# Patient Record
Sex: Female | Born: 1975 | Race: White | Hispanic: No | Marital: Single
Health system: Southern US, Community
[De-identification: ages and names within clinical notes are randomized; demographics above are authoritative.]

---

## 2009-04-19 ENCOUNTER — Emergency Department (HOSPITAL_COMMUNITY): Admission: EM | Admit: 2009-04-19 | Discharge: 2009-04-19 | Payer: Self-pay | Admitting: Family Medicine

## 2010-08-15 ENCOUNTER — Emergency Department (HOSPITAL_COMMUNITY): Admission: EM | Admit: 2010-08-15 | Discharge: 2010-08-15 | Payer: Self-pay | Admitting: Emergency Medicine

## 2011-05-06 ENCOUNTER — Emergency Department (HOSPITAL_COMMUNITY)
Admission: EM | Admit: 2011-05-06 | Discharge: 2011-05-06 | Disposition: A | Discharge status: Home / Self Care | Payer: 59 | Attending: Emergency Medicine | Admitting: Emergency Medicine

## 2011-05-06 ENCOUNTER — Emergency Department (HOSPITAL_COMMUNITY): Payer: 59

## 2011-05-06 DIAGNOSIS — K5900 Constipation, unspecified: Secondary | ICD-10-CM | POA: Insufficient documentation

## 2011-05-06 DIAGNOSIS — F411 Generalized anxiety disorder: Secondary | ICD-10-CM | POA: Insufficient documentation

## 2011-05-06 DIAGNOSIS — R1031 Right lower quadrant pain: Secondary | ICD-10-CM | POA: Insufficient documentation

## 2011-05-06 DIAGNOSIS — R109 Unspecified abdominal pain: Secondary | ICD-10-CM | POA: Insufficient documentation

## 2011-05-06 DIAGNOSIS — J45909 Unspecified asthma, uncomplicated: Secondary | ICD-10-CM | POA: Insufficient documentation

## 2011-05-06 DIAGNOSIS — R112 Nausea with vomiting, unspecified: Secondary | ICD-10-CM | POA: Insufficient documentation

## 2011-05-06 LAB — DIFFERENTIAL
Basophils Absolute: 0 10*3/uL (ref 0.0–0.1)
Basophils Relative: 0 % (ref 0–1)
Eosinophils Absolute: 0.1 10*3/uL (ref 0.0–0.7)
Eosinophils Relative: 1 % (ref 0–5)
Lymphocytes Relative: 14 % (ref 12–46)
Lymphs Abs: 1.4 10*3/uL (ref 0.7–4.0)
Monocytes Absolute: 0.8 10*3/uL (ref 0.1–1.0)
Monocytes Relative: 8 % (ref 3–12)
Neutro Abs: 7.7 10*3/uL (ref 1.7–7.7)
Neutrophils Relative %: 77 % (ref 43–77)

## 2011-05-06 LAB — BASIC METABOLIC PANEL
GFR calc non Af Amer: >60 mL/min (ref >60)
Glucose, Bld: 97 mg/dL (ref 70–99)
Potassium: 4.3 mEq/L (ref 3.5–5.1)
Sodium: 135 mEq/L (ref 135–145)

## 2011-05-06 LAB — URINALYSIS, ROUTINE W REFLEX MICROSCOPIC
Bilirubin Urine: NEGATIVE
Glucose, UA: NEGATIVE mg/dL
Hgb urine dipstick: NEGATIVE
Nitrite: NEGATIVE
Protein, ur: NEGATIVE mg/dL
Specific Gravity, Urine: >1.03 — ABNORMAL HIGH (ref 1.005–1.030)
Urobilinogen, UA: 0.2 mg/dL (ref 0.0–1.0)
pH: 5 (ref 5.0–8.0)

## 2011-05-06 LAB — URINE MICROSCOPIC-ADD ON

## 2011-05-06 LAB — CBC
HCT: 41.9 % (ref 36.0–46.0)
Hemoglobin: 13.4 g/dL (ref 12.0–15.0)
RDW: 13.4 % (ref 11.5–15.5)
WBC: 10 10*3/uL (ref 4.0–10.5)

## 2017-03-15 ENCOUNTER — Emergency Department
Admission: EM | Admit: 2017-03-15 | Discharge: 2017-03-15 | Payer: Self-pay | Attending: Emergency Medicine | Admitting: Emergency Medicine

## 2017-03-15 ENCOUNTER — Emergency Department: Payer: Self-pay

## 2017-03-15 DIAGNOSIS — Z5181 Encounter for therapeutic drug level monitoring: Secondary | ICD-10-CM | POA: Insufficient documentation

## 2017-03-15 DIAGNOSIS — Z9689 Presence of other specified functional implants: Secondary | ICD-10-CM

## 2017-03-15 DIAGNOSIS — T59811A Toxic effect of smoke, accidental (unintentional), initial encounter: Secondary | ICD-10-CM

## 2017-03-15 DIAGNOSIS — T300 Burn of unspecified body region, unspecified degree: Secondary | ICD-10-CM

## 2017-03-15 DIAGNOSIS — J705 Respiratory conditions due to smoke inhalation: Secondary | ICD-10-CM | POA: Insufficient documentation

## 2017-03-15 DIAGNOSIS — N9489 Other specified conditions associated with female genital organs and menstrual cycle: Secondary | ICD-10-CM | POA: Insufficient documentation

## 2017-03-15 LAB — BLOOD GAS, ARTERIAL
ACID-BASE DEFICIT: 7 mmol/L — AB (ref 0.0–2.0)
Bicarbonate: 21.8 mmol/L (ref 20.0–28.0)
FIO2: 1
LHR: 16 {breaths}/min
O2 Saturation: 100 %
PCO2 ART: 57 mmHg — AB (ref 32.0–48.0)
PEEP: 5 cmH2O
Patient temperature: 37
VT: 450 mL
pH, Arterial: 7.19 — CL (ref 7.350–7.450)
pO2, Arterial: 424 mmHg — ABNORMAL HIGH (ref 83.0–108.0)

## 2017-03-15 LAB — CBC WITH DIFFERENTIAL/PLATELET
BASOS PCT: 1 %
Basophils Absolute: 0.1 10*3/uL (ref 0–0.1)
EOS ABS: 0.5 10*3/uL (ref 0–0.7)
Eosinophils Relative: 5 %
HCT: 38.6 % (ref 35.0–47.0)
HEMOGLOBIN: 12.8 g/dL (ref 12.0–16.0)
LYMPHS ABS: 4.8 10*3/uL — AB (ref 1.0–3.6)
Lymphocytes Relative: 43 %
MCH: 31.5 pg (ref 26.0–34.0)
MCHC: 33.2 g/dL (ref 32.0–36.0)
MCV: 95 fL (ref 80.0–100.0)
MONO ABS: 1.3 10*3/uL — AB (ref 0.2–0.9)
MONOS PCT: 11 %
NEUTROS PCT: 40 %
Neutro Abs: 4.5 10*3/uL (ref 1.4–6.5)
Platelets: 455 10*3/uL — ABNORMAL HIGH (ref 150–440)
RBC: 4.06 MIL/uL (ref 3.80–5.20)
RDW: 14.2 % (ref 11.5–14.5)
WBC: 11.2 10*3/uL — ABNORMAL HIGH (ref 3.6–11.0)

## 2017-03-15 LAB — COMPREHENSIVE METABOLIC PANEL
ALBUMIN: 4.2 g/dL (ref 3.5–5.0)
ALK PHOS: 55 U/L (ref 38–126)
ALT: 14 U/L (ref 14–54)
ANION GAP: 8 (ref 5–15)
AST: 21 U/L (ref 15–41)
BILIRUBIN TOTAL: 0.6 mg/dL (ref 0.3–1.2)
BUN: 13 mg/dL (ref 6–20)
CALCIUM: 8.8 mg/dL — AB (ref 8.9–10.3)
CO2: 22 mmol/L (ref 22–32)
CREATININE: 0.76 mg/dL (ref 0.44–1.00)
Chloride: 108 mmol/L (ref 101–111)
GFR calc Af Amer: >60 mL/min (ref >60)
GFR calc non Af Amer: >60 mL/min (ref >60)
GLUCOSE: 98 mg/dL (ref 65–99)
Potassium: 4.1 mmol/L (ref 3.5–5.1)
SODIUM: 138 mmol/L (ref 135–145)
TOTAL PROTEIN: 7.7 g/dL (ref 6.5–8.1)

## 2017-03-15 LAB — URINALYSIS, COMPLETE (UACMP) WITH MICROSCOPIC
BACTERIA UA: NONE SEEN
Bilirubin Urine: NEGATIVE
GLUCOSE, UA: NEGATIVE mg/dL
Hgb urine dipstick: NEGATIVE
Ketones, ur: NEGATIVE mg/dL
LEUKOCYTES UA: NEGATIVE
NITRITE: NEGATIVE
PH: 5 (ref 5.0–8.0)
Protein, ur: 100 mg/dL — AB
Specific Gravity, Urine: 1.026 (ref 1.005–1.030)

## 2017-03-15 LAB — HCG, QUANTITATIVE, PREGNANCY: hCG, Beta Chain, Quant, S: 1 m[IU]/mL (ref <5)

## 2017-03-15 LAB — LACTIC ACID, PLASMA: LACTIC ACID, VENOUS: 1.2 mmol/L (ref 0.5–1.9)

## 2017-03-15 LAB — PROTIME-INR
INR: 0.92
Prothrombin Time: 12.3 seconds (ref 11.4–15.2)

## 2017-03-15 LAB — ETHANOL: Alcohol, Ethyl (B): 77 mg/dL — ABNORMAL HIGH (ref <5)

## 2017-03-15 MED ORDER — ROCURONIUM BROMIDE 50 MG/5ML IV SOLN
80.0000 mg | Freq: Once | INTRAVENOUS | Status: AC
  Administered 2017-03-15: 80 mg via INTRAVENOUS

## 2017-03-15 MED ORDER — FENTANYL 2500MCG IN NS 250ML (10MCG/ML) PREMIX INFUSION
25.0000 ug/h | INTRAVENOUS | Status: DC
  Administered 2017-03-15: 100 ug/h via INTRAVENOUS
  Filled 2017-03-15: qty 250

## 2017-03-15 MED ORDER — PROPOFOL 10 MG/ML IV BOLUS
80.0000 mg | Freq: Once | INTRAVENOUS | Status: AC
  Administered 2017-03-15: 80 mg via INTRAVENOUS

## 2017-03-15 MED ORDER — LACTATED RINGERS IV BOLUS (SEPSIS)
2000.0000 mL | Freq: Once | INTRAVENOUS | Status: AC
  Administered 2017-03-15: 2000 mL via INTRAVENOUS

## 2017-03-15 MED ORDER — SODIUM CHLORIDE 0.9 % IV SOLN
25.0000 ug/h | INTRAVENOUS | Status: DC
  Administered 2017-03-15: 100 ug/h via INTRAVENOUS

## 2017-03-15 MED ORDER — ETOMIDATE 2 MG/ML IV SOLN
15.0000 mg | Freq: Once | INTRAVENOUS | Status: AC
  Administered 2017-03-15: 15 mg via INTRAVENOUS

## 2017-03-15 MED ORDER — FENTANYL CITRATE (PF) 100 MCG/2ML IJ SOLN
100.0000 ug | Freq: Once | INTRAMUSCULAR | Status: AC
  Administered 2017-03-15: 100 ug via INTRAVENOUS

## 2017-03-15 MED ORDER — ETOMIDATE 2 MG/ML IV SOLN
30.0000 mg | Freq: Once | INTRAVENOUS | Status: AC
  Administered 2017-03-15: 30 mg via INTRAVENOUS

## 2017-03-15 MED ORDER — SUCCINYLCHOLINE CHLORIDE 20 MG/ML IJ SOLN
180.0000 mg | Freq: Once | INTRAMUSCULAR | Status: AC
  Administered 2017-03-15: 180 mg via INTRAVENOUS

## 2017-03-15 MED ORDER — PROPOFOL 1000 MG/100ML IV EMUL
5.0000 ug/kg/min | Freq: Once | INTRAVENOUS | Status: AC
  Administered 2017-03-15: 5 ug/kg/min via INTRAVENOUS

## 2017-03-15 NOTE — ED Notes (Signed)
80 rocuronium and 15 of etomidate given

## 2017-03-15 NOTE — ED Notes (Signed)
EDP prepping for chest tube for right pneumothorax
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
## 2017-03-18 LAB — COOXEMETRY PANEL
Carboxyhemoglobin: 4.8 % — ABNORMAL HIGH (ref 0.5–1.5)
METHEMOGLOBIN: 1.6 % — AB (ref 0.0–1.5)
O2 SAT: UNDETERMINED %
TOTAL OXYGEN CONTENT: UNDETERMINED mL/dL

## 2018-07-30 IMAGING — DX DG CHEST 1V PORT
1 series · 1 of 1 positions shown · non-contrast
Comparison: 03/15/2017 at [DATE]

CLINICAL DATA: Right pneumothorax

EXAM:
PORTABLE CHEST 1 VIEW

[chest ap]
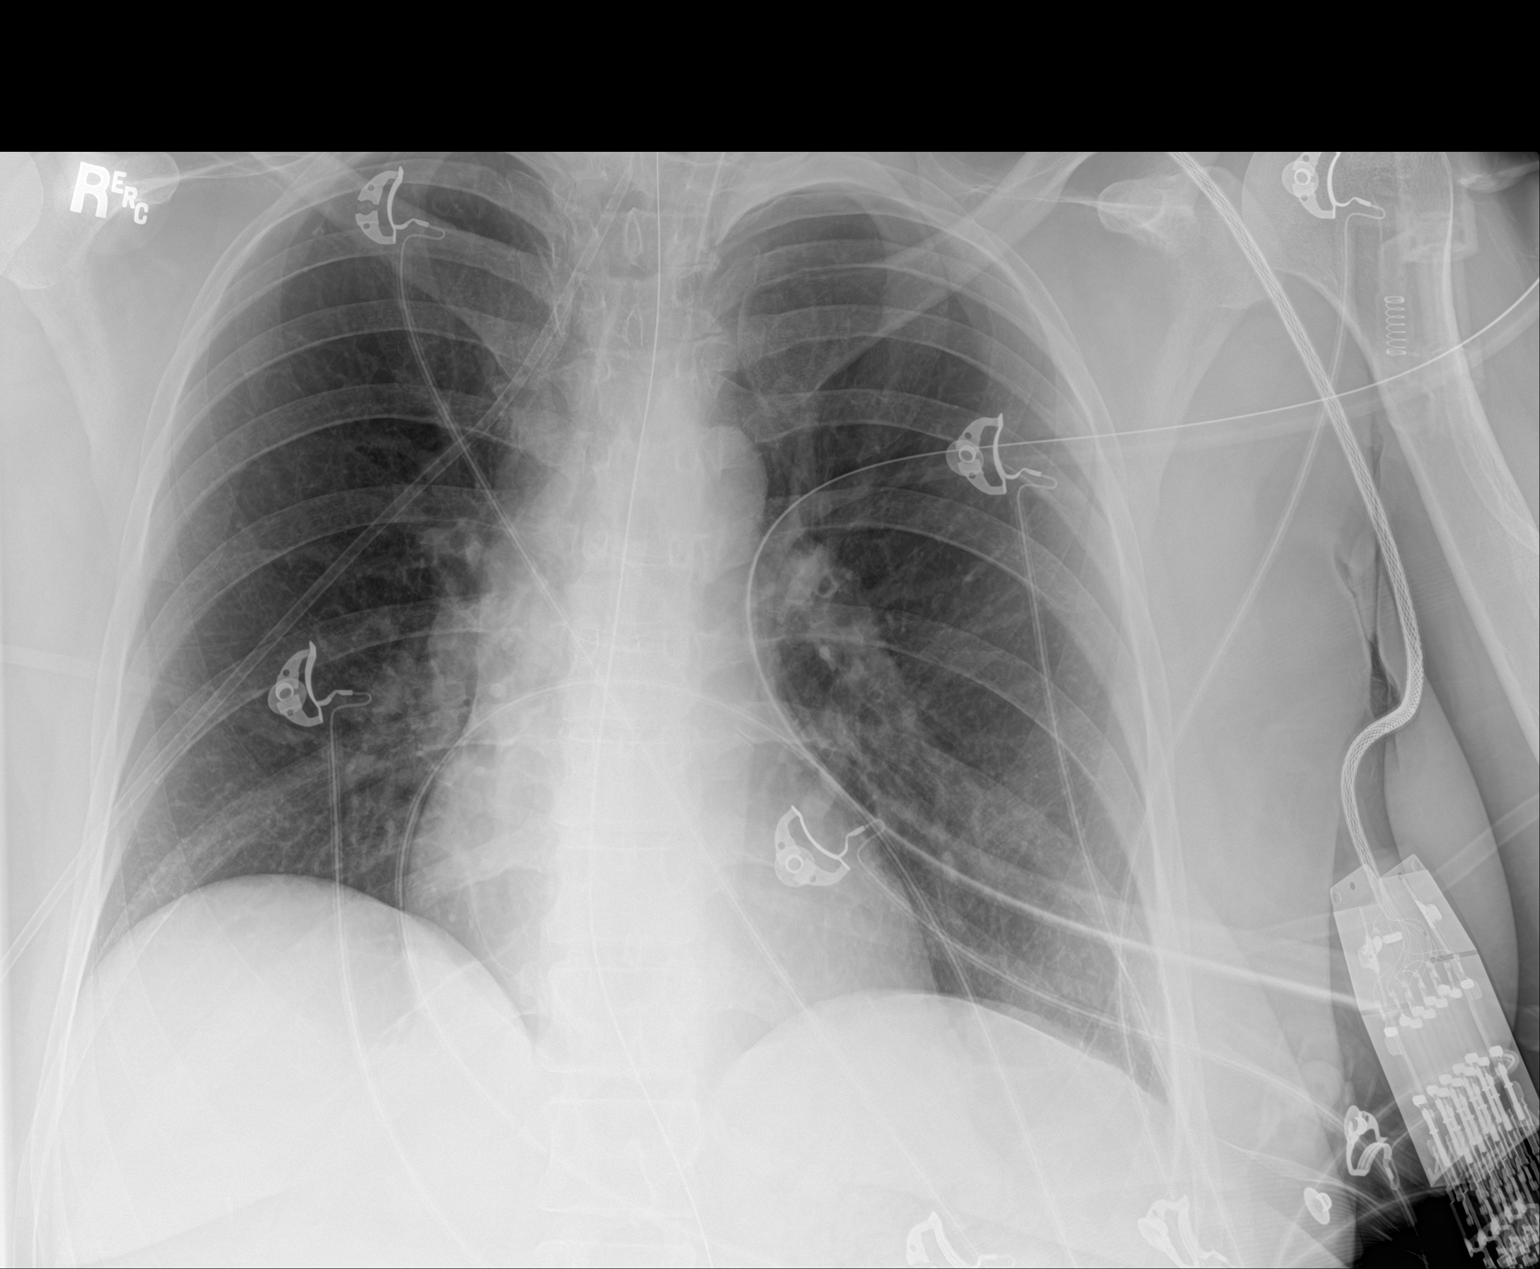

[1 of 1 positions shown; findings below may reference images not displayed]

FINDINGS: A small caliber right chest tube has been placed, extending into the
medial apex. The pneumothorax has resolved.

Endotracheal tube and nasogastric tube remain unchanged in position.
ET tube tip is just above the clavicular heads. NG tube extends into
the stomach and beyond the inferior edge of the image.

No focal airspace consolidation.  Normal pulmonary vasculature.
IMPRESSION: 1. Resolution of the right pneumothorax with placement of a chest
tube. The tube extends into the medial right apex.
2. ET and NG tubes are unchanged in position. ET tube tip is just
above the clavicular heads.
